# Patient Record
Sex: Female | Born: 1960 | Race: White | Hispanic: No | Marital: Single | Smoking: Current every day smoker
Health system: Southern US, Community
[De-identification: ages and names within clinical notes are randomized; demographics above are authoritative.]

## PROBLEM LIST (undated history)

## (undated) DIAGNOSIS — Z8371 Family history of colonic polyps: Secondary | ICD-10-CM

## (undated) DIAGNOSIS — Z808 Family history of malignant neoplasm of other organs or systems: Secondary | ICD-10-CM

## (undated) DIAGNOSIS — M1990 Unspecified osteoarthritis, unspecified site: Secondary | ICD-10-CM

## (undated) DIAGNOSIS — K219 Gastro-esophageal reflux disease without esophagitis: Secondary | ICD-10-CM

## (undated) DIAGNOSIS — I10 Essential (primary) hypertension: Secondary | ICD-10-CM

## (undated) DIAGNOSIS — R200 Anesthesia of skin: Secondary | ICD-10-CM

## (undated) DIAGNOSIS — Z8051 Family history of malignant neoplasm of kidney: Secondary | ICD-10-CM

## (undated) DIAGNOSIS — G43909 Migraine, unspecified, not intractable, without status migrainosus: Secondary | ICD-10-CM

## (undated) DIAGNOSIS — G4730 Sleep apnea, unspecified: Secondary | ICD-10-CM

## (undated) DIAGNOSIS — Z8489 Family history of other specified conditions: Secondary | ICD-10-CM

## (undated) DIAGNOSIS — Z8042 Family history of malignant neoplasm of prostate: Secondary | ICD-10-CM

## (undated) HISTORY — DX: Family history of malignant neoplasm of kidney: Z80.51

## (undated) HISTORY — PX: ABDOMINAL HYSTERECTOMY: SHX81

## (undated) HISTORY — DX: Family history of malignant neoplasm of other organs or systems: Z80.8

## (undated) HISTORY — PX: BACK SURGERY: SHX140

## (undated) HISTORY — DX: Family history of colonic polyps: Z83.71

## (undated) HISTORY — DX: Family history of malignant neoplasm of prostate: Z80.42

---

## 2004-06-22 ENCOUNTER — Ambulatory Visit: Payer: Self-pay | Admitting: Pain Medicine

## 2004-08-01 ENCOUNTER — Ambulatory Visit: Payer: Self-pay | Admitting: Unknown Physician Specialty

## 2006-02-06 ENCOUNTER — Other Ambulatory Visit: Payer: Self-pay

## 2006-04-10 ENCOUNTER — Other Ambulatory Visit: Payer: Self-pay

## 2006-04-21 ENCOUNTER — Inpatient Hospital Stay: Payer: Self-pay | Admitting: Unknown Physician Specialty

## 2006-04-23 ENCOUNTER — Other Ambulatory Visit: Payer: Self-pay

## 2007-12-02 ENCOUNTER — Ambulatory Visit: Payer: Self-pay | Admitting: Pain Medicine

## 2008-04-06 ENCOUNTER — Ambulatory Visit: Payer: Self-pay | Admitting: Pain Medicine

## 2008-06-02 ENCOUNTER — Ambulatory Visit: Payer: Self-pay | Admitting: Pain Medicine

## 2008-07-05 ENCOUNTER — Ambulatory Visit: Payer: Self-pay | Admitting: Physician Assistant

## 2008-08-03 ENCOUNTER — Ambulatory Visit: Payer: Self-pay | Admitting: Pain Medicine

## 2008-09-21 ENCOUNTER — Ambulatory Visit: Payer: Self-pay | Admitting: Pain Medicine

## 2019-05-10 ENCOUNTER — Other Ambulatory Visit: Payer: Self-pay

## 2019-05-10 ENCOUNTER — Ambulatory Visit
Admission: RE | Admit: 2019-05-10 | Discharge: 2019-05-10 | Disposition: A | Discharge status: Home / Self Care | Payer: Medicare HMO | Source: Ambulatory Visit | Attending: Family Medicine | Admitting: Family Medicine

## 2019-05-10 ENCOUNTER — Ambulatory Visit
Admission: RE | Admit: 2019-05-10 | Discharge: 2019-05-10 | Disposition: A | Discharge status: Home / Self Care | Payer: Medicare HMO | Attending: Family Medicine | Admitting: Family Medicine

## 2019-05-10 ENCOUNTER — Other Ambulatory Visit: Payer: Self-pay | Admitting: Family Medicine

## 2019-05-10 DIAGNOSIS — S8001XA Contusion of right knee, initial encounter: Secondary | ICD-10-CM

## 2019-06-21 ENCOUNTER — Encounter: Payer: Self-pay | Admitting: Family Medicine

## 2019-06-21 ENCOUNTER — Other Ambulatory Visit: Payer: Self-pay

## 2019-06-21 ENCOUNTER — Telehealth: Payer: Self-pay

## 2019-06-21 DIAGNOSIS — R195 Other fecal abnormalities: Secondary | ICD-10-CM

## 2019-06-21 NOTE — Telephone Encounter (Signed)
Gastroenterology Pre-Procedure Review  Request Date: Tuesday 07/13/19 Requesting Physician: Dr. Marius Ditch  PATIENT REVIEW QUESTIONS: The patient responded to the following health history questions as indicated:    1. Are you having any GI issues? no 2. Do you have a personal history of Polyps? no 3. Do you have a family history of Colon Cancer or Polyps? no 4. Diabetes Mellitus? no 5. Joint replacements in the past 12 months?no 6. Major health problems in the past 3 months?no 7. Any artificial heart valves, MVP, or defibrillator?no    MEDICATIONS & ALLERGIES:    Patient reports the following regarding taking any anticoagulation/antiplatelet therapy:   Plavix, Coumadin, Eliquis, Xarelto, Lovenox, Pradaxa, Brilinta, or Effient? no Aspirin? no  Patient confirms/reports the following medications:  Current Outpatient Medications  Medication Sig Dispense Refill  . chlorthalidone (HYGROTON) 50 MG tablet      No current facility-administered medications for this visit.    Patient confirms/reports the following allergies:  Not on File  No orders of the defined types were placed in this encounter.   AUTHORIZATION INFORMATION Primary Insurance: 1D#: Group #:  Secondary Insurance: 1D#: Group #:  SCHEDULE INFORMATION: Date: 07/13/19 Time: Location:[facility]

## 2019-07-07 ENCOUNTER — Other Ambulatory Visit: Payer: Self-pay

## 2019-07-07 ENCOUNTER — Encounter: Payer: Self-pay | Admitting: Gastroenterology

## 2019-07-09 ENCOUNTER — Other Ambulatory Visit: Payer: Self-pay

## 2019-07-09 ENCOUNTER — Other Ambulatory Visit
Admission: RE | Admit: 2019-07-09 | Discharge: 2019-07-09 | Disposition: A | Discharge status: Home / Self Care | Payer: Medicare HMO | Source: Ambulatory Visit | Attending: Gastroenterology | Admitting: Gastroenterology

## 2019-07-09 DIAGNOSIS — Z20822 Contact with and (suspected) exposure to covid-19: Secondary | ICD-10-CM | POA: Insufficient documentation

## 2019-07-09 DIAGNOSIS — Z01812 Encounter for preprocedural laboratory examination: Secondary | ICD-10-CM | POA: Diagnosis present

## 2019-07-09 NOTE — Discharge Instructions (Signed)
General Anesthesia, Adult, Care After This sheet gives you information about how to care for yourself after your procedure. Your health care provider may also give you more specific instructions. If you have problems or questions, contact your health care provider. What can I expect after the procedure? After the procedure, the following side effects are common:  Pain or discomfort at the IV site.  Nausea.  Vomiting.  Sore throat.  Trouble concentrating.  Feeling cold or chills.  Weak or tired.  Sleepiness and fatigue.  Soreness and body aches. These side effects can affect parts of the body that were not involved in surgery. Follow these instructions at home:  For at least 24 hours after the procedure:  Have a responsible adult stay with you. It is important to have someone help care for you until you are awake and alert.  Rest as needed.  Do not: ? Participate in activities in which you could fall or become injured. ? Drive. ? Use heavy machinery. ? Drink alcohol. ? Take sleeping pills or medicines that cause drowsiness. ? Make important decisions or sign legal documents. ? Take care of children on your own. Eating and drinking  Follow any instructions from your health care provider about eating or drinking restrictions.  When you feel hungry, start by eating small amounts of foods that are soft and easy to digest (bland), such as toast. Gradually return to your regular diet.  Drink enough fluid to keep your urine pale yellow.  If you vomit, rehydrate by drinking water, juice, or clear broth. General instructions  If you have sleep apnea, surgery and certain medicines can increase your risk for breathing problems. Follow instructions from your health care provider about wearing your sleep device: ? Anytime you are sleeping, including during daytime naps. ? While taking prescription pain medicines, sleeping medicines, or medicines that make you drowsy.  Return to  your normal activities as told by your health care provider. Ask your health care provider what activities are safe for you.  Take over-the-counter and prescription medicines only as told by your health care provider.  If you smoke, do not smoke without supervision.  Keep all follow-up visits as told by your health care provider. This is important. Contact a health care provider if:  You have nausea or vomiting that does not get better with medicine.  You cannot eat or drink without vomiting.  You have pain that does not get better with medicine.  You are unable to pass urine.  You develop a skin rash.  You have a fever.  You have redness around your IV site that gets worse. Get help right away if:  You have difficulty breathing.  You have chest pain.  You have blood in your urine or stool, or you vomit blood. Summary  After the procedure, it is common to have a sore throat or nausea. It is also common to feel tired.  Have a responsible adult stay with you for the first 24 hours after general anesthesia. It is important to have someone help care for you until you are awake and alert.  When you feel hungry, start by eating small amounts of foods that are soft and easy to digest (bland), such as toast. Gradually return to your regular diet.  Drink enough fluid to keep your urine pale yellow.  Return to your normal activities as told by your health care provider. Ask your health care provider what activities are safe for you. This information is not   intended to replace advice given to you by your health care provider. Make sure you discuss any questions you have with your health care provider. Document Revised: 05/16/2017 Document Reviewed: 12/27/2016 Elsevier Patient Education  2020 [employer]vier [employer]  

## 2019-07-10 LAB — SARS CORONAVIRUS 2 (TAT 6-24 HRS): SARS Coronavirus 2: NEGATIVE

## 2019-07-13 ENCOUNTER — Ambulatory Visit: Payer: Medicare HMO | Admitting: Anesthesiology

## 2019-07-13 ENCOUNTER — Other Ambulatory Visit: Payer: Self-pay

## 2019-07-13 ENCOUNTER — Ambulatory Visit
Admission: RE | Admit: 2019-07-13 | Discharge: 2019-07-13 | Disposition: A | Discharge status: Home / Self Care | Payer: Medicare HMO | Attending: Gastroenterology | Admitting: Gastroenterology

## 2019-07-13 ENCOUNTER — Encounter: Payer: Self-pay | Admitting: Gastroenterology

## 2019-07-13 ENCOUNTER — Encounter
Admission: RE | Disposition: A | Discharge status: Home / Self Care | Payer: Self-pay | Source: Home / Self Care | Attending: Gastroenterology

## 2019-07-13 DIAGNOSIS — R195 Other fecal abnormalities: Secondary | ICD-10-CM | POA: Diagnosis not present

## 2019-07-13 DIAGNOSIS — D123 Benign neoplasm of transverse colon: Secondary | ICD-10-CM | POA: Insufficient documentation

## 2019-07-13 DIAGNOSIS — G709 Myoneural disorder, unspecified: Secondary | ICD-10-CM | POA: Insufficient documentation

## 2019-07-13 DIAGNOSIS — D124 Benign neoplasm of descending colon: Secondary | ICD-10-CM | POA: Diagnosis not present

## 2019-07-13 DIAGNOSIS — M170 Bilateral primary osteoarthritis of knee: Secondary | ICD-10-CM | POA: Insufficient documentation

## 2019-07-13 DIAGNOSIS — G4730 Sleep apnea, unspecified: Secondary | ICD-10-CM | POA: Diagnosis not present

## 2019-07-13 DIAGNOSIS — Z79899 Other long term (current) drug therapy: Secondary | ICD-10-CM | POA: Diagnosis not present

## 2019-07-13 DIAGNOSIS — D122 Benign neoplasm of ascending colon: Secondary | ICD-10-CM | POA: Insufficient documentation

## 2019-07-13 DIAGNOSIS — F17210 Nicotine dependence, cigarettes, uncomplicated: Secondary | ICD-10-CM | POA: Insufficient documentation

## 2019-07-13 DIAGNOSIS — Z90710 Acquired absence of both cervix and uterus: Secondary | ICD-10-CM | POA: Insufficient documentation

## 2019-07-13 DIAGNOSIS — D125 Benign neoplasm of sigmoid colon: Secondary | ICD-10-CM | POA: Diagnosis not present

## 2019-07-13 DIAGNOSIS — I10 Essential (primary) hypertension: Secondary | ICD-10-CM | POA: Insufficient documentation

## 2019-07-13 DIAGNOSIS — Z791 Long term (current) use of non-steroidal anti-inflammatories (NSAID): Secondary | ICD-10-CM | POA: Diagnosis not present

## 2019-07-13 DIAGNOSIS — M479 Spondylosis, unspecified: Secondary | ICD-10-CM | POA: Insufficient documentation

## 2019-07-13 DIAGNOSIS — K635 Polyp of colon: Secondary | ICD-10-CM

## 2019-07-13 DIAGNOSIS — K219 Gastro-esophageal reflux disease without esophagitis: Secondary | ICD-10-CM | POA: Insufficient documentation

## 2019-07-13 DIAGNOSIS — D120 Benign neoplasm of cecum: Secondary | ICD-10-CM | POA: Insufficient documentation

## 2019-07-13 HISTORY — PX: POLYPECTOMY: SHX5525

## 2019-07-13 HISTORY — DX: Gastro-esophageal reflux disease without esophagitis: K21.9

## 2019-07-13 HISTORY — DX: Unspecified osteoarthritis, unspecified site: M19.90

## 2019-07-13 HISTORY — DX: Migraine, unspecified, not intractable, without status migrainosus: G43.909

## 2019-07-13 HISTORY — DX: Anesthesia of skin: R20.0

## 2019-07-13 HISTORY — DX: Sleep apnea, unspecified: G47.30

## 2019-07-13 HISTORY — PX: COLONOSCOPY WITH PROPOFOL: SHX5780

## 2019-07-13 HISTORY — DX: Essential (primary) hypertension: I10

## 2019-07-13 HISTORY — DX: Family history of other specified conditions: Z84.89

## 2019-07-13 SURGERY — COLONOSCOPY WITH PROPOFOL
Anesthesia: General | Site: Rectum

## 2019-07-13 MED ORDER — LABETALOL HCL 5 MG/ML IV SOLN
INTRAVENOUS | Status: DC | PRN
  Administered 2019-07-13 (×5): 5 mg via INTRAVENOUS

## 2019-07-13 MED ORDER — LIDOCAINE HCL (CARDIAC) PF 100 MG/5ML IV SOSY
PREFILLED_SYRINGE | INTRAVENOUS | Status: DC | PRN
  Administered 2019-07-13: 50 mg via INTRAVENOUS

## 2019-07-13 MED ORDER — PROPOFOL 10 MG/ML IV BOLUS
INTRAVENOUS | Status: DC | PRN
  Administered 2019-07-13 (×2): 30 mg via INTRAVENOUS
  Administered 2019-07-13: 20 mg via INTRAVENOUS
  Administered 2019-07-13: 30 mg via INTRAVENOUS
  Administered 2019-07-13: 20 mg via INTRAVENOUS
  Administered 2019-07-13: 30 mg via INTRAVENOUS
  Administered 2019-07-13: 20 mg via INTRAVENOUS
  Administered 2019-07-13: 30 mg via INTRAVENOUS
  Administered 2019-07-13: 40 mg via INTRAVENOUS
  Administered 2019-07-13: 30 mg via INTRAVENOUS
  Administered 2019-07-13: 20 mg via INTRAVENOUS
  Administered 2019-07-13: 30 mg via INTRAVENOUS
  Administered 2019-07-13 (×2): 20 mg via INTRAVENOUS
  Administered 2019-07-13: 70 mg via INTRAVENOUS
  Administered 2019-07-13: 10 mg via INTRAVENOUS
  Administered 2019-07-13: 30 mg via INTRAVENOUS
  Administered 2019-07-13 (×9): 20 mg via INTRAVENOUS
  Administered 2019-07-13: 40 mg via INTRAVENOUS
  Administered 2019-07-13: 30 mg via INTRAVENOUS
  Administered 2019-07-13: 20 mg via INTRAVENOUS
  Administered 2019-07-13: 30 mg via INTRAVENOUS
  Administered 2019-07-13 (×3): 20 mg via INTRAVENOUS
  Administered 2019-07-13: 40 mg via INTRAVENOUS
  Administered 2019-07-13: 30 mg via INTRAVENOUS
  Administered 2019-07-13: 20 mg via INTRAVENOUS

## 2019-07-13 MED ORDER — LACTATED RINGERS IV SOLN: INTRAVENOUS | Status: DC

## 2019-07-13 MED ORDER — STERILE WATER FOR IRRIGATION IR SOLN
Status: DC | PRN
  Administered 2019-07-13: 14:00:00 100 mL

## 2019-07-13 MED ORDER — ONDANSETRON HCL 4 MG/2ML IJ SOLN: 4.0000 mg | Freq: Once | INTRAMUSCULAR | Status: DC | PRN

## 2019-07-13 SURGICAL SUPPLY — 31 items
CANISTER SUCT 1200ML W/VALVE (MISCELLANEOUS) ×3 IMPLANT
CLIP HMST 235XBRD CATH ROT (MISCELLANEOUS) IMPLANT
CLIP RESOLUTION 360 11X235 (MISCELLANEOUS) ×6
ELECT REM PT RETURN 9FT ADLT (ELECTROSURGICAL) ×3
ELECTRODE REM PT RTRN 9FT ADLT (ELECTROSURGICAL) IMPLANT
ELEVIEW  INJECTABLE COMP 10 (MISCELLANEOUS) ×6
FCP ESCP3.2XJMB 240X2.8X (MISCELLANEOUS)
FORCEPS BIOP RAD 4 LRG CAP 4 (CUTTING FORCEPS) IMPLANT
FORCEPS BIOP RJ4 240 W/NDL (MISCELLANEOUS)
FORCEPS ESCP3.2XJMB 240X2.8X (MISCELLANEOUS) IMPLANT
GOWN CVR UNV OPN BCK APRN NK (MISCELLANEOUS) ×2 IMPLANT
GOWN ISOL THUMB LOOP REG UNIV (MISCELLANEOUS) ×6
INJECTABLE ELEVIEW COMP 10 (MISCELLANEOUS) IMPLANT
INJECTOR VARIJECT VIN23 (MISCELLANEOUS) IMPLANT
KIT DEFENDO VALVE AND CONN (KITS) IMPLANT
KIT ENDO PROCEDURE OLY (KITS) ×3 IMPLANT
MARKER SPOT ENDO TATTOO 5ML (MISCELLANEOUS) IMPLANT
NDL 18GX1X1/2 (RX/OR ONLY) (NEEDLE) IMPLANT
NEEDLE 18GX1X1/2 (RX/OR ONLY) (NEEDLE) ×3 IMPLANT
PROBE APC STR FIRE (PROBE) IMPLANT
RETRIEVER NET ROTH 2.5X230 LF (MISCELLANEOUS) ×2 IMPLANT
SNARE COLD EXACTO (MISCELLANEOUS) ×2 IMPLANT
SNARE LASSO HEX 3 IN 1 (INSTRUMENTS) ×4 IMPLANT
SNARE SHORT THROW 13M SML OVAL (MISCELLANEOUS) IMPLANT
SNARE SHORT THROW 30M LRG OVAL (MISCELLANEOUS) IMPLANT
SNARE SNG USE RND 15MM (INSTRUMENTS) IMPLANT
SPOT EX ENDOSCOPIC TATTOO (MISCELLANEOUS)
SYR 10ML LL (SYRINGE) ×2 IMPLANT
TRAP ETRAP POLY (MISCELLANEOUS) IMPLANT
VARIJECT INJECTOR VIN23 (MISCELLANEOUS) ×3
WATER STERILE IRR 250ML POUR (IV SOLUTION) ×3 IMPLANT

## 2019-07-13 NOTE — Anesthesia Postprocedure Evaluation (Signed)
Anesthesia Post Note  Patient: Cynthia Curtis  Procedure(s) Performed: COLONOSCOPY WITH PROPOFOL (N/A Rectum) POLYPECTOMY (N/A Rectum)     Patient location during evaluation: PACU Anesthesia Type: General Level of consciousness: awake and alert Pain management: pain level controlled Vital Signs Assessment: post-procedure vital signs reviewed and stable Respiratory status: spontaneous breathing, nonlabored ventilation, respiratory function stable and patient connected to nasal cannula oxygen Cardiovascular status: blood pressure returned to baseline and stable Postop Assessment: no apparent nausea or vomiting Anesthetic complications: no    Alisa Graff

## 2019-07-13 NOTE — H&P (Signed)
Cephas Darby, MD [address]  [place]  [place], [place] [address]  Main: [phone]  Fax: [phone] Pager: [phone]  Primary Care Physician:  Lynnell Jude, MD Primary Gastroenterologist:  Dr. Cephas Darby  Pre-Procedure History & Physical: HPI:  Cynthia Curtis is a 59 y.o. female is here for an colonoscopy.   Past Medical History:  Diagnosis Date  . Arthritis    knees, lower back  . Bilateral leg numbness    s/p back surgeries  . Family history of adverse reaction to anesthesia    Mother - PONV  . GERD (gastroesophageal reflux disease)   . Hypertension   . Migraine headache    only when using "denture glue"  . Sleep apnea    CPAP    Past Surgical History:  Procedure Laterality Date  . ABDOMINAL HYSTERECTOMY    . BACK SURGERY     x2    Prior to Admission medications   Medication Sig Start Date End Date Taking? Authorizing Provider  CANNABIDIOL PO Take 25 mg by mouth daily. CBD   Yes [provider]  celecoxib (CELEBREX) 200 MG capsule Take 200 mg by mouth daily.   Yes [provider]  chlorthalidone (HYGROTON) 50 MG tablet  03/30/19  Yes [provider]  DULoxetine (CYMBALTA) 60 MG capsule Take 120 mg by mouth daily.   Yes [provider]  esomeprazole (NEXIUM) 20 MG capsule Take 20 mg by mouth daily at 12 noon.   Yes [provider]  nortriptyline (PAMELOR) 50 MG capsule Take 50 mg by mouth 2 (two) times daily.   Yes [provider]  traZODone (DESYREL) 100 MG tablet Take 200 mg by mouth at bedtime.   Yes [provider]  zolpidem (AMBIEN) 10 MG tablet Take 10 mg by mouth at bedtime as needed for sleep.   Yes [provider]    Allergies as of 06/21/2019  . (Not on File)    History reviewed. No pertinent family history.  Social History   Socioeconomic History  . Marital status: Single    Spouse name: Not on file  . Number of children: Not on file  . Years of  education: Not on file  . Highest education level: Not on file  Occupational History  . Not on file  Tobacco Use  . Smoking status: Current Every Day Smoker    Packs/day: 1.00    Years: 40.00    Pack years: 40.00    Types: Cigarettes  . Smokeless tobacco: Never Used  . Tobacco comment: since teenager  Substance and Sexual Activity  . Alcohol use: Not Currently  . Drug use: Not on file  . Sexual activity: Not on file  Other Topics Concern  . Not on file  Social History Narrative  . Not on file   Social Determinants of Health   Financial Resource Strain:   . Difficulty of Paying Living Expenses: Not on file  Food Insecurity:   . Worried About Charity fundraiser in the Last Year: Not on file  . Ran Out of Food in the Last Year: Not on file  Transportation Needs:   . Lack of Transportation (Medical): Not on file  . Lack of Transportation (Non-Medical): Not on file  Physical Activity:   . Days of Exercise per Week: Not on file  . Minutes of Exercise per Session: Not on file  Stress:   . Feeling of Stress : Not on file  Social  Connections:   . Frequency of Communication with Friends and Family: Not on file  . Frequency of Social Gatherings with Friends and Family: Not on file  . Attends Religious Services: Not on file  . Active Member of Clubs or Organizations: Not on file  . Attends Archivist Meetings: Not on file  . Marital Status: Not on file  Intimate Partner Violence:   . Fear of Current or Ex-Partner: Not on file  . Emotionally Abused: Not on file  . Physically Abused: Not on file  . Sexually Abused: Not on file    Review of Systems: See HPI, otherwise negative ROS  Physical Exam: BP (!) 146/96   Pulse (!) 110   Temp (!) 97.2 F (36.2 C) (Temporal)   Resp 16   Ht 5\' 1"  (1.549 m)   Wt 71.2 kg   SpO2 97%   BMI 29.66 kg/m  General:   Alert,  pleasant and cooperative in NAD Head:  Normocephalic and atraumatic. Neck:  Supple; no masses or  thyromegaly. Lungs:  Clear throughout to auscultation.    Heart:  Regular rate and rhythm. Abdomen:  Soft, nontender and nondistended. Normal bowel sounds, without guarding, and without rebound.   Neurologic:  Alert and  oriented x4;  grossly normal neurologically.  Impression/Plan: Cynthia Curtis is here for an colonoscopy to be performed for positive cologaurd  Risks, benefits, limitations, and alternatives regarding  colonoscopy have been reviewed with the patient.  Questions have been answered.  All parties agreeable.   Sherri Sear, MD  07/13/2019, 1:20 PM

## 2019-07-13 NOTE — Anesthesia Procedure Notes (Signed)
Performed by: Whittney Steenson, CRNA Pre-anesthesia Checklist: Patient identified, Emergency Drugs available, Suction available, Timeout performed and Patient being monitored Patient Re-evaluated:Patient Re-evaluated prior to induction Oxygen Delivery Method: Nasal cannula Placement Confirmation: positive ETCO2       

## 2019-07-13 NOTE — Op Note (Signed)
[facility] Gastroenterology Patient Name: Cynthia Curtis Procedure Date: 07/13/2019 2:07 PM MRN: [id number] Account #: [id number] Date of Birth: 1961-05-07 Admit Type: Outpatient Age: 59 Room: [facility] OR ROOM 01 Gender: Female Note Status: Finalized Procedure:             Colonoscopy Indications:           This is the patient's first colonoscopy, Positive                         Cologuard test Providers:             Lin Landsman MD, MD Referring MD:          Lynnell Jude (Referring MD) Medicines:             Monitored Anesthesia Care Complications:         No immediate complications. Estimated blood loss:                         Minimal. Procedure:             Pre-Anesthesia Assessment:                        - Prior to the procedure, a History and Physical was                         performed, and patient medications and allergies were                         reviewed. The patient is competent. The risks and                         benefits of the procedure and the sedation options and                         risks were discussed with the patient. All questions                         were answered and informed consent was obtained.                         Patient identification and proposed procedure were                         verified by the physician, the nurse, the                         anesthesiologist, the anesthetist and the technician                         in the pre-procedure area in the procedure room in the                         endoscopy suite. Mental Status Examination: alert and                         oriented. Airway Examination: normal oropharyngeal  airway and neck mobility. Respiratory Examination:                         clear to auscultation. CV Examination: normal.                         Prophylactic Antibiotics: The patient does not require                         prophylactic antibiotics. Prior  Anticoagulants: The                         patient has taken no previous anticoagulant or                         antiplatelet agents. ASA Grade Assessment: II - A                         patient with mild systemic disease. After reviewing                         the risks and benefits, the patient was deemed in                         satisfactory condition to undergo the procedure. The                         anesthesia plan was to use monitored anesthesia care                         (MAC). Immediately prior to administration of                         medications, the patient was re-assessed for adequacy                         to receive sedatives. The heart rate, respiratory                         rate, oxygen saturations, blood pressure, adequacy of                         pulmonary ventilation, and response to care were                         monitored throughout the procedure. The physical                         status of the patient was re-assessed after the                         procedure.                        After obtaining informed consent, the colonoscope was                         passed under direct vision. Throughout the procedure,  the patient's blood pressure, pulse, and oxygen                         saturations were monitored continuously. The was                         introduced through the anus and advanced to the the                         cecum, identified by appendiceal orifice and ileocecal                         valve. The colonoscopy was unusually difficult due to                         inadequate bowel prep, significant looping and the                         patient's body habitus. Successful completion of the                         procedure was aided by applying abdominal pressure.                         The patient tolerated the procedure well. The quality                         of the bowel preparation was adequate  to identify                         polyps 6 mm and larger in size. Findings:      The perianal and digital rectal examinations were normal. Pertinent       negatives include normal sphincter tone and no palpable rectal lesions.      A 6 mm polyp was found in the cecum. The polyp was flat. Preparations       were made for mucosal resection. Saline was injected to raise the       lesion. Snare mucosal resection was performed. Resection and retrieval       were complete.      A 15 mm polyp was found in the cecum. The polyp was carpet-like and       flat. Preparations were made for mucosal resection. Eleview was injected       to raise the lesion. Snare mucosal resection was performed. Resection       was incomplete. The resected tissue was retrieved.      Three sessile polyps were found in the transverse colon (2) and       ascending colon (1). The polyps were 10 to 12 mm in size. These polyps       were removed with a hot snare. Resection and retrieval were complete. To       prevent bleeding after mucosal resection, two hemostatic clips were       successfully placed (MR conditional). There was no bleeding during, or       at the end, of the procedure.      Four sessile polyps were found in the descending colon. The polyps were       6 to 8 mm  in size. These polyps were removed with a hot snare. Resection       and retrieval were complete.      Five sessile polyps were found in the sigmoid colon. The polyps were 5       to 7 mm in size. These polyps were removed with a hot snare (4) and cold       snare (1). Resection and retrieval were complete.      The retroflexed view of the distal rectum and anal verge was normal and       showed no anal or rectal abnormalities. Impression:            - One 6 mm polyp in the cecum, removed with mucosal                         resection. Resected and retrieved.                        - One 15 mm polyp in the cecum, removed with mucosal                          resection. Incomplete resection. Resected tissue                         retrieved.                        - Three 10 to 12 mm polyps in the transverse colon and                         in the ascending colon, removed with a hot snare.                         Resected and retrieved. Clips (MR conditional) were                         placed.                        - Four 6 to 8 mm polyps in the descending colon,                         removed with a hot snare. Resected and retrieved.                        - Five 5 to 7 mm polyps in the sigmoid colon, removed                         with a hot snare and cold snare. Resected and                         retrieved.                        - Mucosal resection was performed. Resection and                         retrieval were complete.                        -  Mucosal resection was performed. Resection was                         incomplete. The resected tissue was retrieved. Recommendation:        - Discharge patient to home (with escort).                        - Resume previous diet today.                        - Continue present medications.                        - Await pathology results.                        - Repeat colonoscopy in 6 months with 2 day prep for                         surveillance after piecemeal polypectomy. Procedure Code(s):     --- Professional ---                        [phone], Colonoscopy, flexible; with endoscopic mucosal                         resection                        [id number], 59, Colonoscopy, flexible; with removal of                         tumor(s), polyp(s), or other lesion(s) by snare                         technique Diagnosis Code(s):     --- Professional ---                        K63.5, Polyp of colon                        R19.5, Other fecal abnormalities CPT copyright 2019 American Medical Association. All rights reserved. The codes documented in this report are preliminary and  upon coder review may  be revised to meet current compliance requirements. Dr. Ulyess Mort Lin Landsman MD, MD 07/13/2019 3:53:45 PM This report has been signed electronically. Number of Addenda: 0 Note Initiated On: 07/13/2019 2:07 PM Scope Withdrawal Time: 1 hour 20 minutes 10 seconds  Total Procedure Duration: 1 hour 29 minutes 3 seconds  Estimated Blood Loss:  Estimated blood loss: none. Estimated blood loss was                         minimal. Estimated blood loss was minimal.      [facility]

## 2019-07-13 NOTE — Anesthesia Preprocedure Evaluation (Addendum)
Anesthesia Evaluation  Patient identified by MRN, date of birth, ID band Patient awake    Reviewed: Allergy & Precautions, H&P , NPO status , Patient's Chart, lab work & pertinent test results, reviewed documented beta blocker date and time   Airway Mallampati: II  TM Distance: >3 FB Neck ROM: full    Dental no notable dental hx.    Pulmonary sleep apnea and Continuous Positive Airway Pressure Ventilation , Current Smoker,    Pulmonary exam normal breath sounds clear to auscultation       Cardiovascular Exercise Tolerance: Good hypertension,  Rhythm:regular Rate:Normal     Neuro/Psych  Headaches,  Neuromuscular disease (BLE neuropathy) negative psych ROS   GI/Hepatic Neg liver ROS, GERD  ,  Endo/Other  negative endocrine ROS  Renal/GU negative Renal ROS  negative genitourinary   Musculoskeletal   Abdominal   Peds  Hematology negative hematology ROS (+)   Anesthesia Other Findings   Reproductive/Obstetrics negative OB ROS                            Anesthesia Physical Anesthesia Plan  ASA: II  Anesthesia Plan: General   Post-op Pain Management:    Induction:   PONV Risk Score and Plan: 2 and Propofol infusion and TIVA  Airway Management Planned:   Additional Equipment:   Intra-op Plan:   Post-operative Plan:   Informed Consent: I have reviewed the patients History and Physical, chart, labs and discussed the procedure including the risks, benefits and alternatives for the proposed anesthesia with the patient or authorized representative who has indicated his/her understanding and acceptance.     Dental Advisory Given  Plan Discussed with: CRNA  Anesthesia Plan Comments:         Anesthesia Quick Evaluation

## 2019-07-13 NOTE — Transfer of Care (Signed)
Immediate Anesthesia Transfer of Care Note  Patient: Cynthia Curtis  Procedure(s) Performed: COLONOSCOPY WITH PROPOFOL (N/A Rectum) POLYPECTOMY (N/A Rectum)  Patient Location: PACU  Anesthesia Type: General  Level of Consciousness: awake, alert  and patient cooperative  Airway and Oxygen Therapy: Patient Spontanous Breathing and Patient connected to supplemental oxygen  Post-op Assessment: Post-op Vital signs reviewed, Patient's Cardiovascular Status Stable, Respiratory Function Stable, Patent Airway and No signs of Nausea or vomiting  Post-op Vital Signs: Reviewed and stable  Complications: No apparent anesthesia complications

## 2019-07-14 ENCOUNTER — Encounter: Payer: Self-pay | Admitting: *Deleted

## 2019-07-15 ENCOUNTER — Encounter: Payer: Self-pay | Admitting: Gastroenterology

## 2019-07-15 LAB — SURGICAL PATHOLOGY

## 2019-12-14 ENCOUNTER — Other Ambulatory Visit: Payer: Self-pay

## 2019-12-14 DIAGNOSIS — Z86010 Personal history of colonic polyps: Secondary | ICD-10-CM

## 2019-12-14 MED ORDER — NA SULFATE-K SULFATE-MG SULF 17.5-3.13-1.6 GM/177ML PO SOLN: 354.0000 mL | Freq: Once | ORAL | 0 refills | Status: AC

## 2019-12-14 MED ORDER — MAGNESIUM CITRATE PO SOLN: 1.0000 | Freq: Once | ORAL | 0 refills | Status: AC

## 2020-01-17 ENCOUNTER — Other Ambulatory Visit: Payer: Self-pay

## 2020-01-17 ENCOUNTER — Other Ambulatory Visit
Admission: RE | Admit: 2020-01-17 | Discharge: 2020-01-17 | Disposition: A | Discharge status: Home / Self Care | Payer: Medicare HMO | Source: Ambulatory Visit | Attending: Gastroenterology | Admitting: Gastroenterology

## 2020-01-17 DIAGNOSIS — Z01812 Encounter for preprocedural laboratory examination: Secondary | ICD-10-CM | POA: Insufficient documentation

## 2020-01-17 DIAGNOSIS — Z20822 Contact with and (suspected) exposure to covid-19: Secondary | ICD-10-CM | POA: Diagnosis not present

## 2020-01-18 LAB — SARS CORONAVIRUS 2 (TAT 6-24 HRS): SARS Coronavirus 2: NEGATIVE

## 2020-01-19 ENCOUNTER — Ambulatory Visit: Payer: Medicare HMO | Admitting: Anesthesiology

## 2020-01-19 ENCOUNTER — Encounter: Payer: Self-pay | Admitting: Gastroenterology

## 2020-01-19 ENCOUNTER — Other Ambulatory Visit: Payer: Self-pay

## 2020-01-19 ENCOUNTER — Ambulatory Visit
Admission: RE | Admit: 2020-01-19 | Discharge: 2020-01-19 | Disposition: A | Discharge status: Home / Self Care | Payer: Medicare HMO | Attending: Gastroenterology | Admitting: Gastroenterology

## 2020-01-19 ENCOUNTER — Telehealth: Payer: Self-pay

## 2020-01-19 ENCOUNTER — Encounter
Admission: RE | Disposition: A | Discharge status: Home / Self Care | Payer: Self-pay | Source: Home / Self Care | Attending: Gastroenterology

## 2020-01-19 DIAGNOSIS — Z79899 Other long term (current) drug therapy: Secondary | ICD-10-CM | POA: Insufficient documentation

## 2020-01-19 DIAGNOSIS — Z860100 Personal history of colon polyps, unspecified: Secondary | ICD-10-CM

## 2020-01-19 DIAGNOSIS — G4730 Sleep apnea, unspecified: Secondary | ICD-10-CM | POA: Insufficient documentation

## 2020-01-19 DIAGNOSIS — K219 Gastro-esophageal reflux disease without esophagitis: Secondary | ICD-10-CM | POA: Diagnosis not present

## 2020-01-19 DIAGNOSIS — Z791 Long term (current) use of non-steroidal anti-inflammatories (NSAID): Secondary | ICD-10-CM | POA: Diagnosis not present

## 2020-01-19 DIAGNOSIS — Z09 Encounter for follow-up examination after completed treatment for conditions other than malignant neoplasm: Secondary | ICD-10-CM | POA: Diagnosis present

## 2020-01-19 DIAGNOSIS — D124 Benign neoplasm of descending colon: Secondary | ICD-10-CM | POA: Insufficient documentation

## 2020-01-19 DIAGNOSIS — I10 Essential (primary) hypertension: Secondary | ICD-10-CM | POA: Insufficient documentation

## 2020-01-19 DIAGNOSIS — F17210 Nicotine dependence, cigarettes, uncomplicated: Secondary | ICD-10-CM | POA: Insufficient documentation

## 2020-01-19 DIAGNOSIS — K635 Polyp of colon: Secondary | ICD-10-CM | POA: Diagnosis not present

## 2020-01-19 HISTORY — PX: COLONOSCOPY WITH PROPOFOL: SHX5780

## 2020-01-19 SURGERY — COLONOSCOPY WITH PROPOFOL
Anesthesia: General

## 2020-01-19 MED ORDER — FENTANYL CITRATE (PF) 100 MCG/2ML IJ SOLN
INTRAMUSCULAR | Status: AC
  Filled 2020-01-19: qty 2

## 2020-01-19 MED ORDER — FENTANYL CITRATE (PF) 100 MCG/2ML IJ SOLN
INTRAMUSCULAR | Status: DC | PRN
Start: 2020-01-19 — End: 2020-01-19
  Administered 2020-01-19 (×2): 50 ug via INTRAVENOUS

## 2020-01-19 MED ORDER — MIDAZOLAM HCL 2 MG/2ML IJ SOLN
INTRAMUSCULAR | Status: AC
  Filled 2020-01-19: qty 2

## 2020-01-19 MED ORDER — PROPOFOL 10 MG/ML IV BOLUS
INTRAVENOUS | Status: DC | PRN
  Administered 2020-01-19: 50 mg via INTRAVENOUS

## 2020-01-19 MED ORDER — ONDANSETRON HCL 4 MG/2ML IJ SOLN
INTRAMUSCULAR | Status: DC | PRN
  Administered 2020-01-19: 4 mg via INTRAVENOUS

## 2020-01-19 MED ORDER — PROPOFOL 500 MG/50ML IV EMUL
INTRAVENOUS | Status: DC | PRN
  Administered 2020-01-19: 75 ug/kg/min via INTRAVENOUS

## 2020-01-19 MED ORDER — SODIUM CHLORIDE 0.9 % IV SOLN: INTRAVENOUS | Status: DC

## 2020-01-19 MED ORDER — MIDAZOLAM HCL 2 MG/2ML IJ SOLN
INTRAMUSCULAR | Status: DC | PRN
  Administered 2020-01-19: 2 mg via INTRAVENOUS

## 2020-01-19 NOTE — Transfer of Care (Signed)
Immediate Anesthesia Transfer of Care Note  Patient: Cynthia Curtis  Procedure(s) Performed: COLONOSCOPY WITH PROPOFOL (N/A )  Patient Location: PACU  Anesthesia Type:General  Level of Consciousness: awake and alert   Airway & Oxygen Therapy: Patient Spontanous Breathing and Patient connected to nasal cannula oxygen  Post-op Assessment: Report given to RN and Post -op Vital signs reviewed and stable  Post vital signs: Reviewed and stable  Last Vitals:  Vitals Value Taken Time  BP    Temp    Pulse    Resp    SpO2      Last Pain:  Vitals:   01/19/20 0834  PainSc: 0-No pain         Complications: No complications documented.

## 2020-01-19 NOTE — Anesthesia Preprocedure Evaluation (Signed)
Anesthesia Evaluation  Patient identified by MRN, date of birth, ID band Patient awake    Reviewed: Allergy & Precautions, H&P , NPO status , Patient's Chart, lab work & pertinent test results, reviewed documented beta blocker date and time   History of Anesthesia Complications Negative for: history of anesthetic complications  Airway Mallampati: II  TM Distance: >3 FB Neck ROM: full    Dental  (+) Edentulous Upper, Edentulous Lower   Pulmonary sleep apnea and Continuous Positive Airway Pressure Ventilation , neg COPD, Current SmokerPatient did not abstain from smoking.,    Pulmonary exam normal breath sounds clear to auscultation       Cardiovascular Exercise Tolerance: Good METShypertension, (-) CAD and (-) Past MI (-) dysrhythmias  Rhythm:regular Rate:Normal - Systolic murmurs    Neuro/Psych  Headaches,  Neuromuscular disease (BLE neuropathy) negative psych ROS   GI/Hepatic Neg liver ROS, GERD  Medicated and Controlled,  Endo/Other  negative endocrine ROSneg diabetes  Renal/GU negative Renal ROS  negative genitourinary   Musculoskeletal   Abdominal   Peds  Hematology negative hematology ROS (+)   Anesthesia Other Findings Past Medical History: No date: Arthritis     Comment:  knees, lower back No date: Bilateral leg numbness     Comment:  s/p back surgeries No date: Family history of adverse reaction to anesthesia     Comment:  Mother - PONV No date: GERD (gastroesophageal reflux disease) No date: Hypertension No date: Migraine headache     Comment:  only when using "denture glue" No date: Sleep apnea     Comment:  CPAP  Reproductive/Obstetrics negative OB ROS                             Anesthesia Physical  Anesthesia Plan  ASA: II  Anesthesia Plan: General   Post-op Pain Management:    Induction: Intravenous  PONV Risk Score and Plan: 2 and Propofol infusion and  TIVA  Airway Management Planned: Nasal Cannula  Additional Equipment: None  Intra-op Plan:   Post-operative Plan:   Informed Consent: I have reviewed the patients History and Physical, chart, labs and discussed the procedure including the risks, benefits and alternatives for the proposed anesthesia with the patient or authorized representative who has indicated his/her understanding and acceptance.     Dental Advisory Given  Plan Discussed with: CRNA  Anesthesia Plan Comments: (Discussed risks of anesthesia with patient, including possibility of difficulty with spontaneous ventilation under anesthesia necessitating airway intervention, PONV, and rare risks such as cardiac or respiratory or neurological events. Patient understands.)        Anesthesia Quick Evaluation

## 2020-01-19 NOTE — H&P (Signed)
Cephas Darby, MD [address]  [place]  [place], [place] [address]  Main: [phone]  Fax: [phone] Pager: [phone]  Primary Care Physician:  Lynnell Jude, MD Primary Gastroenterologist:  Dr. Cephas Darby  Pre-Procedure History & Physical: HPI:  Cynthia Curtis is a 59 y.o. female is here for an colonoscopy.   Past Medical History:  Diagnosis Date  . Arthritis    knees, lower back  . Bilateral leg numbness    s/p back surgeries  . Family history of adverse reaction to anesthesia    Mother - PONV  . GERD (gastroesophageal reflux disease)   . Hypertension   . Migraine headache    only when using "denture glue"  . Sleep apnea    CPAP    Past Surgical History:  Procedure Laterality Date  . ABDOMINAL HYSTERECTOMY    . BACK SURGERY     x2  . COLONOSCOPY WITH PROPOFOL N/A 07/13/2019   Procedure: COLONOSCOPY WITH PROPOFOL;  Surgeon: Lin Landsman, MD;  Location: [facility];  Service: Endoscopy;  Laterality: N/A;  sleep apnea Priority 3  . POLYPECTOMY N/A 07/13/2019   Procedure: POLYPECTOMY;  Surgeon: Lin Landsman, MD;  Location: [facility];  Service: Endoscopy;  Laterality: N/A;    Prior to Admission medications   Medication Sig Start Date End Date Taking? Authorizing Provider  CANNABIDIOL PO Take 25 mg by mouth daily. CBD   Yes [provider]  celecoxib (CELEBREX) 200 MG capsule Take 200 mg by mouth daily.   Yes [provider]  chlorthalidone (HYGROTON) 50 MG tablet  03/30/19  Yes [provider]  DULoxetine (CYMBALTA) 60 MG capsule Take 120 mg by mouth daily.   Yes [provider]  esomeprazole (NEXIUM) 20 MG capsule Take 20 mg by mouth daily at 12 noon.   Yes [provider]  lisinopril (ZESTRIL) 5 MG tablet Take 5 mg by mouth daily.   Yes [provider]  nortriptyline (PAMELOR) 50 MG capsule Take 50 mg by mouth 2 (two) times daily.   Yes [provider]    traZODone (DESYREL) 100 MG tablet Take 200 mg by mouth at bedtime.   Yes [provider]  zolpidem (AMBIEN) 10 MG tablet Take 10 mg by mouth at bedtime as needed for sleep.   Yes [provider]    Allergies as of 12/14/2019 - Review Complete 07/13/2019  Allergen Reaction Noted  . Ceftin [cefuroxime] Swelling 07/07/2019  . Other  07/07/2019    History reviewed. No pertinent family history.  Social History   Socioeconomic History  . Marital status: Single    Spouse name: Not on file  . Number of children: Not on file  . Years of education: Not on file  . Highest education level: Not on file  Occupational History  . Not on file  Tobacco Use  . Smoking status: Current Every Day Smoker    Packs/day: 1.00    Years: 40.00    Pack years: 40.00    Types: Cigarettes  . Smokeless tobacco: Never Used  . Tobacco comment: since teenager  Vaping Use  . Vaping Use: Never used  Substance and Sexual Activity  . Alcohol use: Not Currently  . Drug use: Not on file  . Sexual activity: Not on file  Other Topics Concern  . Not on file  Social History Narrative  . Not on file   Social Determinants of Health   Financial Resource Strain:   .  Difficulty of Paying Living Expenses: Not on file  Food Insecurity:   . Worried About Charity fundraiser in the Last Year: Not on file  . Ran Out of Food in the Last Year: Not on file  Transportation Needs:   . Lack of Transportation (Medical): Not on file  . Lack of Transportation (Non-Medical): Not on file  Physical Activity:   . Days of Exercise per Week: Not on file  . Minutes of Exercise per Session: Not on file  Stress:   . Feeling of Stress : Not on file  Social Connections:   . Frequency of Communication with Friends and Family: Not on file  . Frequency of Social Gatherings with Friends and Family: Not on file  . Attends Religious Services: Not on file  . Active Member of Clubs or Organizations: Not on file  .  Attends Archivist Meetings: Not on file  . Marital Status: Not on file  Intimate Partner Violence:   . Fear of Current or Ex-Partner: Not on file  . Emotionally Abused: Not on file  . Physically Abused: Not on file  . Sexually Abused: Not on file    Review of Systems: See HPI, otherwise negative ROS  Physical Exam: BP (!) 142/97   Pulse (!) 112   Temp 97.9 F (36.6 C)   Resp 18   Ht 5\' 2"  (1.575 m)   Wt 68.5 kg   SpO2 96%   BMI 27.62 kg/m  General:   Alert,  pleasant and cooperative in NAD Head:  Normocephalic and atraumatic. Neck:  Supple; no masses or thyromegaly. Lungs:  Clear throughout to auscultation.    Heart:  Regular rate and rhythm. Abdomen:  Soft, nontender and nondistended. Normal bowel sounds, without guarding, and without rebound.   Neurologic:  Alert and  oriented x4;  grossly normal neurologically.  Impression/Plan: Cynthia Curtis is here for an colonoscopy to be performed for personal h/o adenomas  Risks, benefits, limitations, and alternatives regarding  colonoscopy have been reviewed with the patient.  Questions have been answered.  All parties agreeable.   Sherri Sear, MD  01/19/2020, 8:47 AM

## 2020-01-19 NOTE — Anesthesia Postprocedure Evaluation (Signed)
Anesthesia Post Note  Patient: Cynthia Curtis  Procedure(s) Performed: COLONOSCOPY WITH PROPOFOL (N/A )  Patient location during evaluation: Endoscopy Anesthesia Type: General Level of consciousness: awake and alert Pain management: pain level controlled Vital Signs Assessment: post-procedure vital signs reviewed and stable Respiratory status: spontaneous breathing, nonlabored ventilation, respiratory function stable and patient connected to nasal cannula oxygen Cardiovascular status: blood pressure returned to baseline and stable Postop Assessment: no apparent nausea or vomiting Anesthetic complications: no   No complications documented.   Last Vitals:  Vitals:   01/19/20 0940 01/19/20 0950  BP: 99/68 100/72  Pulse: (!) 103 99  Resp: 17 18  Temp:    SpO2: 99% 99%    Last Pain:  Vitals:   01/19/20 0834  PainSc: 0-No pain                 Arita Miss

## 2020-01-19 NOTE — Telephone Encounter (Signed)
-----   Message from Lin Landsman, MD sent at 01/19/2020  8:49 AM EDT ----- Regarding: Referral Please refer her to genetic counselor for h/o multiple ademonas of colon  RV

## 2020-01-19 NOTE — Op Note (Signed)
[facility]astroenter[facility] Patient Name: Cynthia Curtis Procedure Date: 01/19/2020 8:47 AM MRN: [id number] Account #: [id number] Date of Birth: 1961-01-22 Admit Type: Outpatient Age: 59 Room: [facility] ENDO ROOM 4 Gender: Female Note Status: Finalized Procedure:             Colonoscopy Indications:           High risk colon cancer surveillance: Personal history                         of adenoma (10 mm or greater in size), High risk colon                         cancer surveillance: Personal history of sessile                         serrated colon polyp (10 mm or greater in size), Last                         colonoscopy: February 2021 Providers:             Lin Landsman MD, MD Medicines:             Monitored Anesthesia Care Complications:         No immediate complications. Estimated blood loss: None. Procedure:             Pre-Anesthesia Assessment:                        - Prior to the procedure, a History and Physical was                         performed, and patient medications and allergies were                         reviewed. The patient is competent. The risks and                         benefits of the procedure and the sedation options and                         risks were discussed with the patient. All questions                         were answered and informed consent was obtained.                         Patient identification and proposed procedure were                         verified by the physician, the nurse, the                         anesthesiologist, the anesthetist and the technician                         in the pre-procedure area in the procedure room in the  endoscopy suite. Mental Status Examination: alert and                         oriented. Airway Examination: normal oropharyngeal                         airway and neck mobility. Respiratory Examination:                         clear to auscultation. CV  Examination: normal.                         Prophylactic Antibiotics: The patient does not require                         prophylactic antibiotics. Prior Anticoagulants: The                         patient has taken no previous anticoagulant or                         antiplatelet agents. ASA Grade Assessment: II - A                         patient with mild systemic disease. After reviewing                         the risks and benefits, the patient was deemed in                         satisfactory condition to undergo the procedure. The                         anesthesia plan was to use monitored anesthesia care                         (MAC). Immediately prior to administration of                         medications, the patient was re-assessed for adequacy                         to receive sedatives. The heart rate, respiratory                         rate, oxygen saturations, blood pressure, adequacy of                         pulmonary ventilation, and response to care were                         monitored throughout the procedure. The physical                         status of the patient was re-assessed after the                         procedure.  After obtaining informed consent, the colonoscope was                         passed under direct vision. Throughout the procedure,                         the patient's blood pressure, pulse, and oxygen                         saturations were monitored continuously. The                         Colonoscope was introduced through the anus and                         advanced to the the cecum, identified by appendiceal                         orifice and ileocecal valve. The colonoscopy was                         performed with moderate difficulty due to restricted                         mobility of the colon and significant looping.                         Successful completion of the procedure was aided by                          changing the patient to a prone position and applying                         abdominal pressure. The patient tolerated the                         procedure fairly well. The quality of the bowel                         preparation was evaluated using the BBPS [facility] Bowel                         Preparation Scale) with scores of: Right Colon = 3,                         Transverse Colon = 3 and Left Colon = 3 (entire mucosa                         seen well with no residual staining, small fragments                         of stool or opaque liquid). The total BBPS score                         equals 9. Findings:      The perianal and digital rectal examinations were normal. Pertinent       negatives include normal sphincter tone and  no palpable rectal lesions.      A 6 mm polyp was found in the descending colon. The polyp was sessile.       The polyp was removed with a cold snare. Resection and retrieval were       complete.      The retroflexed view of the distal rectum and anal verge was normal and       showed no anal or rectal abnormalities. Impression:            - One 6 mm polyp in the descending colon, removed with                         a cold snare. Resected and retrieved.                        - The distal rectum and anal verge are normal on                         retroflexion view. Recommendation:        - Discharge patient to home (with escort).                        - High fiber diet today.                        - Continue present medications.                        - Await pathology results.                        - Repeat colonoscopy in 3 years for surveillance of                         multiple polyps. Procedure Code(s):     --- Professional ---                        [phone], Colonoscopy, flexible; with removal of                         tumor(s), polyp(s), or other lesion(s) by snare                         technique Diagnosis Code(s):      --- Professional ---                        K63.5, Polyp of colon                        Z86.010, Personal history of colonic polyps CPT copyright 2019 [employer]. All rights reserved. The codes documented in this report are preliminary and upon coder review may  be revised to meet current compliance requirements. Dr. Ulyess Mort Lin Landsman MD, MD 01/19/2020 9:22:29 AM This report has been signed electronically. Number of Addenda: 0 Note Initiated On: 01/19/2020 8:47 AM Scope Withdrawal Time: 0 hours 10 minutes 12 seconds  Total Procedure Duration: 0 hours 20 minutes 26 seconds  Estimated Blood Loss:  Estimated blood loss: none.      [facility]

## 2020-01-19 NOTE — Telephone Encounter (Signed)
Placed referral for patient

## 2020-01-20 ENCOUNTER — Encounter: Payer: Self-pay | Admitting: Gastroenterology

## 2020-01-20 LAB — SURGICAL PATHOLOGY

## 2020-01-21 ENCOUNTER — Encounter: Payer: Self-pay | Admitting: Gastroenterology

## 2020-01-24 ENCOUNTER — Encounter: Payer: Self-pay | Admitting: Licensed Clinical Social Worker

## 2020-01-24 ENCOUNTER — Inpatient Hospital Stay: Payer: Medicare HMO

## 2020-01-24 ENCOUNTER — Inpatient Hospital Stay: Payer: Medicare HMO | Attending: Oncology | Admitting: Licensed Clinical Social Worker

## 2020-01-24 ENCOUNTER — Other Ambulatory Visit: Payer: Self-pay

## 2020-01-24 DIAGNOSIS — Z8051 Family history of malignant neoplasm of kidney: Secondary | ICD-10-CM | POA: Diagnosis not present

## 2020-01-24 DIAGNOSIS — Z8042 Family history of malignant neoplasm of prostate: Secondary | ICD-10-CM | POA: Diagnosis not present

## 2020-01-24 DIAGNOSIS — Z8371 Family history of colonic polyps: Secondary | ICD-10-CM

## 2020-01-24 DIAGNOSIS — Z808 Family history of malignant neoplasm of other organs or systems: Secondary | ICD-10-CM

## 2020-01-24 DIAGNOSIS — Z83719 Family history of colon polyps, unspecified: Secondary | ICD-10-CM | POA: Insufficient documentation

## 2020-01-24 DIAGNOSIS — Z86010 Personal history of colonic polyps: Secondary | ICD-10-CM

## 2020-01-24 NOTE — Progress Notes (Signed)
REFERRING PROVIDER: Lin Landsman, MD [address] [place],  [place] [address]  PRIMARY PROVIDER:  Lynnell Jude, MD  PRIMARY REASON FOR VISIT:  1. History of colonic polyps   2. Family history of colonic polyps   3. Family history of kidney cancer   4. Family history of prostate cancer   5. Family history of bone cancer      HISTORY OF PRESENT ILLNESS:   Cynthia Curtis, a 59 y.o. female, was seen for a [place] cancer genetics consultation at the request of Dr. Marius Ditch due to a personal history of colon polyps.  Cynthia Curtis presents to clinic today to discuss the possibility of a hereditary predisposition to cancer, genetic testing, and to further clarify her future cancer risks, as well as potential cancer risks for family members.   Cynthia Curtis is a 59 y.o. female with no personal history of cancer. She had her first colonoscopy 6 months ago that revealed 14 polyps, and another colonoscopy recently that revealed an additional polyp for a total of 15 cumulative polyps. Six of these polyps were tubular adenomas.   CANCER HISTORY:  Oncology History   No history exists.     RISK FACTORS:  Menarche was at age 17.  First live birth at age 44.  OCP use: yes Ovaries intact: no.  Hysterectomy: yes.  Menopausal status: postmenopausal.  HRT use: 0 years. Colonoscopy: yes; details above. Mammogram within the last year: yes. Number of breast biopsies: 0. Up to date with pelvic exams: yes. Any excessive radiation exposure in the past: no  Past Medical History:  Diagnosis Date  . Arthritis    knees, lower back  . Bilateral leg numbness    s/p back surgeries  . Family history of adverse reaction to anesthesia    Mother - PONV  . Family history of bone cancer   . Family history of colonic polyps   . Family history of kidney cancer   . Family history of prostate cancer   . GERD (gastroesophageal reflux disease)   . Hypertension   . Migraine headache    only when using  "denture glue"  . Sleep apnea    CPAP    Past Surgical History:  Procedure Laterality Date  . ABDOMINAL HYSTERECTOMY    . BACK SURGERY     x2  . COLONOSCOPY WITH PROPOFOL N/A 07/13/2019   Procedure: COLONOSCOPY WITH PROPOFOL;  Surgeon: Lin Landsman, MD;  Location: [facility];  Service: Endoscopy;  Laterality: N/A;  sleep apnea Priority 3  . COLONOSCOPY WITH PROPOFOL N/A 01/19/2020   Procedure: COLONOSCOPY WITH PROPOFOL;  Surgeon: Lin Landsman, MD;  Location: [facility] ENDOSCOPY;  Service: Gastroenterology;  Laterality: N/A;  . POLYPECTOMY N/A 07/13/2019   Procedure: POLYPECTOMY;  Surgeon: Lin Landsman, MD;  Location: [facility];  Service: Endoscopy;  Laterality: N/A;    Social History   Socioeconomic History  . Marital status: Single    Spouse name: Not on file  . Number of children: Not on file  . Years of education: Not on file  . Highest education level: Not on file  Occupational History  . Not on file  Tobacco Use  . Smoking status: Current Every Day Smoker    Packs/day: 1.00    Years: 40.00    Pack years: 40.00    Types: Cigarettes  . Smokeless tobacco: Never Used  . Tobacco comment: since teenager  Vaping Use  . Vaping Use: Never used  Substance and Sexual Activity  . Alcohol use: Not Currently  . Drug use: Not on file  . Sexual activity: Not on file  Other Topics Concern  . Not on file  Social History Narrative  . Not on file   Social Determinants of Health   Financial Resource Strain:   . Difficulty of Paying Living Expenses: Not on file  Food Insecurity:   . Worried About Charity fundraiser in the Last Year: Not on file  . Ran Out of Food in the Last Year: Not on file  Transportation Needs:   . Lack of Transportation (Medical): Not on file  . Lack of Transportation (Non-Medical): Not on file  Physical Activity:   . Days of Exercise per Week: Not on file  . Minutes of Exercise per Session: Not on file  Stress:   .  Feeling of Stress : Not on file  Social Connections:   . Frequency of Communication with Friends and Family: Not on file  . Frequency of Social Gatherings with Friends and Family: Not on file  . Attends Religious Services: Not on file  . Active Member of Clubs or Organizations: Not on file  . Attends Archivist Meetings: Not on file  . Marital Status: Not on file     FAMILY HISTORY:  We obtained a detailed, 4-generation family history.  Significant diagnoses are listed below: Family History  Problem Relation Age of Onset  . Cancer Mother        stage IV, unsure primary  . Colon polyps Father   . Colon polyps Sister   . Bone cancer Maternal Aunt   . Prostate cancer Maternal Grandfather   . Kidney cancer Maternal Uncle   . Other Maternal Uncle        possible cancer  . Colon polyps Cousin        "fair amount"   Cynthia Curtis has 2 sons, ages 63 and 4 and a daughter, age 52. None have had cancer or colonoscopies. Patient has 1 sister, 11, who has had at least 8 colon polyps.   Cynthia Curtis mother is living at 28. She was diagnosed with stage IV cancer, unsure the primary. Patient had 7 maternal uncles and 3 maternal aunts. An uncle had kidney cancer. Another uncle also possibly had cancer, he had agent orange exposure. An aunt had bone cancer. Maternal grandfather had prostate cancer, he passed at 68. Grandmother also passed at 46.  Cynthia Curtis father died at 31. He had colon polyps but she is not sure the amount. Patient had 1 paternal aunt. She did not have cancer or colon polyps that patient is aware of. She did have a daughter, patient's cousin, who has had a "fair amount" of colon polyps. Paternal grandmother died at 37, grandfather died at 83, no cancers.   Cynthia Curtis is unaware of previous family history of genetic testing for hereditary cancer risks. Patient's maternal ancestors are of [place], [place] and [place] descent, and paternal ancestors are of [place] descent. There  is no reported Ashkenazi Jewish ancestry. There is no known consanguinity.  GENETIC COUNSELING ASSESSMENT: Cynthia Curtis is a 59 y.o. female with a personal history of colon polyps which is somewhat suggestive of a hereditary cancer syndrome/hereditary polyposis syndrome and predisposition to cancer. We, therefore, discussed and recommended the following at today's visit.   DISCUSSION:  We discussed that polyps in general are common, however, most people have fewer than 5 lifetime polyps.  When an individual  has 10 or more polyps we become concerned about an underlying polyposis syndrome.  The most common hereditary polyposis syndromes are caused by problems in the APC and MUTYH genes, however, more recently, mutations in the [place] and MSH3 genes have been identified in some polyposis families. We discussed that testing is beneficial for several reasons including knowing how to follow individuals for cancer screenings, and understand if other family members could be at risk for cancer and allow them to undergo genetic testing.   We reviewed the characteristics, features and inheritance patterns of hereditary cancer syndromes. We also discussed genetic testing, including the appropriate family members to test, the process of testing, insurance coverage and turn-around-time for results. We discussed the implications of a negative, positive and/or variant of uncertain significant result. We recommended Cynthia Curtis pursue genetic testing for the [facility] Multi-Cancer gene panel.   The Multi-Cancer Panel offered by [facility] includes sequencing and/or deletion duplication testing of the following 85 genes: AIP, ALK, APC, ATM, AXIN2,BAP1,  BARD1, BLM, BMPR1A, BRCA1, BRCA2, BRIP1, CASR, CDC73, CDH1, CDK4, CDKN1B, CDKN1C, CDKN2A (p14ARF), CDKN2A (p16INK4a), CEBPA, CHEK2, CTNNA1, DICER1, DIS3L2, EGFR (c.2369C>T, p.Thr790Met variant only), EPCAM (Deletion/duplication testing only), FH, FLCN, GATA2, GPC3, GREM1 (Promoter region  deletion/duplication testing only), HOXB13 (c.251G>A, p.Gly84Glu), HRAS, KIT, MAX, MEN1, MET, MITF (c.952G>A, p.Glu318Lys variant only), MLH1, MSH2, MSH3, MSH6, MUTYH, NBN, NF1, NF2, NTHL1, PALB2, PDGFRA, PHOX2B, PMS2, POLD1, POLE, POT1, PRKAR1A, PTCH1, PTEN, RAD50, RAD51C, RAD51D, RB1, RECQL4, RET, RNF43, RUNX1, SDHAF2, SDHA (sequence changes only), SDHB, SDHC, SDHD, SMAD4, SMARCA4, SMARCB1, SMARCE1, STK11, SUFU, TERC, TERT, TMEM127, TP53, TSC1, TSC2, VHL, WRN and WT1.   Based on Cynthia Curtis's personal history of polyps, she meets medical criteria for genetic testing. Despite that she meets criteria, she may still have an out of pocket cost.   We discussed that some people do not want to undergo genetic testing due to fear of genetic discrimination.  A federal law called the Genetic Information Non-Discrimination Act (GINA) of 2008 helps protect individuals against genetic discrimination based on their genetic test results.  It impacts both health insurance and employment.  For health insurance, it protects against increased premiums, being kicked off insurance or being forced to take a test in order to be insured.  For employment it protects against hiring, firing and promoting decisions based on genetic test results.  Health status due to a cancer diagnosis is not protected under GINA.  This law does not protect life insurance, disability insurance, or other types of insurance.   PLAN: After considering the risks, benefits, and limitations, Cynthia Curtis provided informed consent to pursue genetic testing and the blood sample was sent to [facility] for analysis of the Multi-Cancer Panel. Results should be available within approximately 2-3 weeks' time, at which point they will be disclosed by telephone to Cynthia Curtis, as will any additional recommendations warranted by these results. Cynthia Curtis will receive a summary of her genetic counseling visit and a copy of her results once available. This information  will also be available in Epic.   Lastly, we encouraged Cynthia Curtis to remain in contact with cancer genetics annually so that we can continuously update the family history and inform her of any changes in cancer genetics and testing that may be of benefit for this family.   Cynthia Curtis questions were answered to her satisfaction today. Our contact information was provided should additional questions or concerns arise. Thank you for the referral and allowing [place] to share in the care  of your patient.   Faith Rogue, MS, [facility] Genetic Counselor [email or web address] Phone: [phone]  The patient was seen for a total of 30 minutes in face-to-face genetic counseling.  Dr. Grayland Ormond was available for discussion regarding this case.   _______________________________________________________________________ For Office Staff:  Number of people involved in session: 1 Was an Intern/ student involved with case: no

## 2020-02-03 ENCOUNTER — Telehealth: Payer: Self-pay | Admitting: Licensed Clinical Social Worker

## 2020-02-03 NOTE — Telephone Encounter (Signed)
Revealed negative genetic testing.  .  We discussed that we do not know why she has polyps or why there is cancer in the family. It could be due to a different gene that we are not testing, or something our current technology cannot pick up.  It will be important for her to keep in contact with genetics to learn if additional testing may be needed in the future.

## 2020-02-04 ENCOUNTER — Encounter: Payer: Self-pay | Admitting: Licensed Clinical Social Worker

## 2020-02-04 ENCOUNTER — Ambulatory Visit: Payer: Self-pay | Admitting: Licensed Clinical Social Worker

## 2020-02-04 DIAGNOSIS — Z8371 Family history of colonic polyps: Secondary | ICD-10-CM

## 2020-02-04 DIAGNOSIS — Z808 Family history of malignant neoplasm of other organs or systems: Secondary | ICD-10-CM

## 2020-02-04 DIAGNOSIS — Z8051 Family history of malignant neoplasm of kidney: Secondary | ICD-10-CM

## 2020-02-04 DIAGNOSIS — Z8042 Family history of malignant neoplasm of prostate: Secondary | ICD-10-CM

## 2020-02-04 DIAGNOSIS — Z86010 Personal history of colonic polyps: Secondary | ICD-10-CM

## 2020-02-04 DIAGNOSIS — Z1379 Encounter for other screening for genetic and chromosomal anomalies: Secondary | ICD-10-CM

## 2020-02-04 NOTE — Progress Notes (Signed)
HPI:  Cynthia Curtis was previously seen in the [place] clinic due to a personal history of colon polyps and concerns regarding a hereditary predisposition to cancer. Please refer to our prior cancer genetics clinic note for more information regarding our discussion, assessment and recommendations, at the time. Cynthia Curtis recent genetic test results were disclosed to her, as were recommendations warranted by these results. These results and recommendations are discussed in more detail below.  CANCER HISTORY:  Oncology History   No history exists.    FAMILY HISTORY:  We obtained a detailed, 4-generation family history.  Significant diagnoses are listed below: Family History  Problem Relation Age of Onset  . Cancer Mother        stage IV, unsure primary  . Colon polyps Father   . Colon polyps Sister   . Bone cancer Maternal Aunt   . Prostate cancer Maternal Grandfather   . Kidney cancer Maternal Uncle   . Other Maternal Uncle        possible cancer  . Colon polyps Cousin        "fair amount"   Cynthia Curtis has 2 sons, ages 51 and 73 and a daughter, age 48. None have had cancer or colonoscopies. Patient has 1 sister, 66, who has had at least 8 colon polyps.   Cynthia Curtis mother is living at 71. She was diagnosed with stage IV cancer, unsure the primary. Patient had 7 maternal uncles and 3 maternal aunts. An uncle had kidney cancer. Another uncle also possibly had cancer, he had agent orange exposure. An aunt had bone cancer. Maternal grandfather had prostate cancer, he passed at 36. Grandmother also passed at 39.  Cynthia Curtis father died at 80. He had colon polyps but she is not sure the amount. Patient had 1 paternal aunt. She did not have cancer or colon polyps that patient is aware of. She did have a daughter, patient's cousin, who has had a "fair amount" of colon polyps. Paternal grandmother died at 30, grandfather died at 1, no cancers.   Cynthia Curtis is unaware of  previous family history of genetic testing for hereditary cancer risks. Patient's maternal ancestors are of [place], [place] and [place] descent, and paternal ancestors are of [place] descent. There is no reported Ashkenazi Jewish ancestry. There is no known consanguinity.  GENETIC TEST RESULTS: Genetic testing reported out on 02/03/2020 through the [facility] Multi-cancer panel found no pathogenic mutations.   The Multi-Cancer Panel offered by [facility] includes sequencing and/or deletion duplication testing of the following 85 genes: AIP, ALK, APC, ATM, AXIN2,BAP1,  BARD1, BLM, BMPR1A, BRCA1, BRCA2, BRIP1, CASR, CDC73, CDH1, CDK4, CDKN1B, CDKN1C, CDKN2A (p14ARF), CDKN2A (p16INK4a), CEBPA, CHEK2, CTNNA1, DICER1, DIS3L2, EGFR (c.2369C>T, p.Thr790Met variant only), EPCAM (Deletion/duplication testing only), FH, FLCN, GATA2, GPC3, GREM1 (Promoter region deletion/duplication testing only), HOXB13 (c.251G>A, p.Gly84Glu), HRAS, KIT, MAX, MEN1, MET, MITF (c.952G>A, p.Glu318Lys variant only), MLH1, MSH2, MSH3, MSH6, MUTYH, NBN, NF1, NF2, NTHL1, PALB2, PDGFRA, PHOX2B, PMS2, POLD1, POLE, POT1, PRKAR1A, PTCH1, PTEN, RAD50, RAD51C, RAD51D, RB1, RECQL4, RET, RNF43, RUNX1, SDHAF2, SDHA (sequence changes only), SDHB, SDHC, SDHD, SMAD4, SMARCA4, SMARCB1, SMARCE1, STK11, SUFU, TERC, TERT, TMEM127, TP53, TSC1, TSC2, VHL, WRN and WT1.   The test report has been scanned into EPIC and is located under the Molecular Pathology section of the Results Review tab.  A portion of the result report is included below for reference.     We discussed with Cynthia Curtis that because current genetic testing is not perfect,  it is possible there may be a gene mutation in one of these genes that current testing cannot detect, but that chance is small.  We also discussed, that there could be another gene that has not yet been discovered, or that we have not yet tested, that is responsible for the cancer diagnoses in the family. It is also possible there  is a hereditary cause for the cancer in the family that Cynthia Curtis did not inherit and therefore was not identified in her testing.  Therefore, it is important to remain in touch with cancer genetics in the future so that we can continue to offer Cynthia Curtis the most up to date genetic testing.   ADDITIONAL GENETIC TESTING: We discussed with Cynthia Curtis that her genetic testing was fairly extensive.  If there are genes identified to increase cancer risk that can be analyzed in the future, we would be happy to discuss and coordinate this testing at that time.    CANCER SCREENING RECOMMENDATIONS: Cynthia Curtis test result is considered negative (normal).  This means that we have not identified a hereditary cause for her personal history of polyps and family history of cancer at this time.   While reassuring, this does not definitively rule out a hereditary predisposition to cancer. It is still possible that there could be genetic mutations that are undetectable by current technology. There could be genetic mutations in genes that have not been tested or identified to increase cancer risk.  Therefore, it is recommended she continue to follow the cancer management and screening guidelines provided by her primary healthcare provider.   An individual's cancer risk and medical management are not determined by genetic test results alone. Overall cancer risk assessment incorporates additional factors, including personal medical history, family history, and any available genetic information that may result in a personalized plan for cancer prevention and surveillance.  This negative genetic test simply tells [place] that we cannot yet define why Cynthia Curtis has had an increased number of colorectal polyps.  Cynthia Curtis medical management and screening should be based on the prospect that she will likely form more colon polyps  and should, therefore, undergo more frequent colonoscopy screening at intervals determined by her GI  providers.  We also recommended that Cynthia Curtis have an upper endoscopy periodically.  RECOMMENDATIONS FOR FAMILY MEMBERS:  Relatives in this family might be at some increased risk of developing cancer, over the general population risk, simply due to the family history of cancer.  We recommended female relatives in this family have a yearly mammogram beginning at age 59, or 64 years younger than the earliest onset of cancer, an annual clinical breast exam, and perform monthly breast self-exams. Female relatives in this family should also have a gynecological exam as recommended by their primary provider.  All family members should be referred for colonoscopy starting at age 71.   FOLLOW-UP: Lastly, we discussed with Cynthia Curtis that cancer genetics is a rapidly advancing field and it is possible that new genetic tests will be appropriate for her and/or her family members in the future. We encouraged her to remain in contact with cancer genetics on an annual basis so we can update her personal and family histories and let her know of advances in cancer genetics that may benefit this family.   Our contact number was provided. Cynthia Curtis questions were answered to her satisfaction, and she knows she is welcome to call us at anytime with additional questions or concerns.  Faith Rogue, MS, [facility] Genetic Counselor [email or web address] Phone: [phone]

## 2021-08-29 IMAGING — CR DG KNEE COMPLETE 4+V*R*
4 series · 4 of 4 positions shown · non-contrast
Comparison: None.

CLINICAL DATA: Initial evaluation for pain in medial side of knee
for 2 weeks, contusion.

EXAM:
RIGHT KNEE - COMPLETE 4+ VIEW

[knee ap]
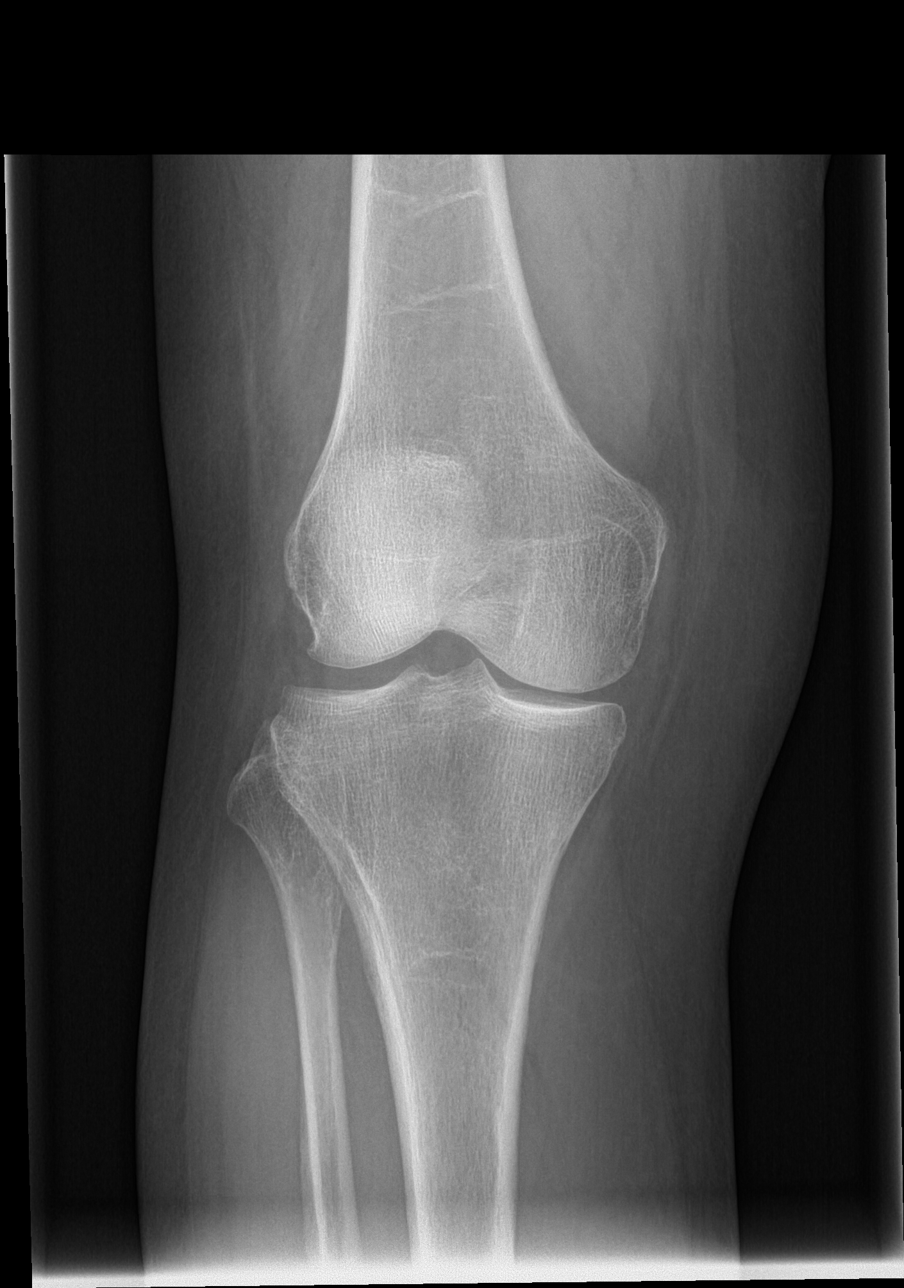

[knee lat]
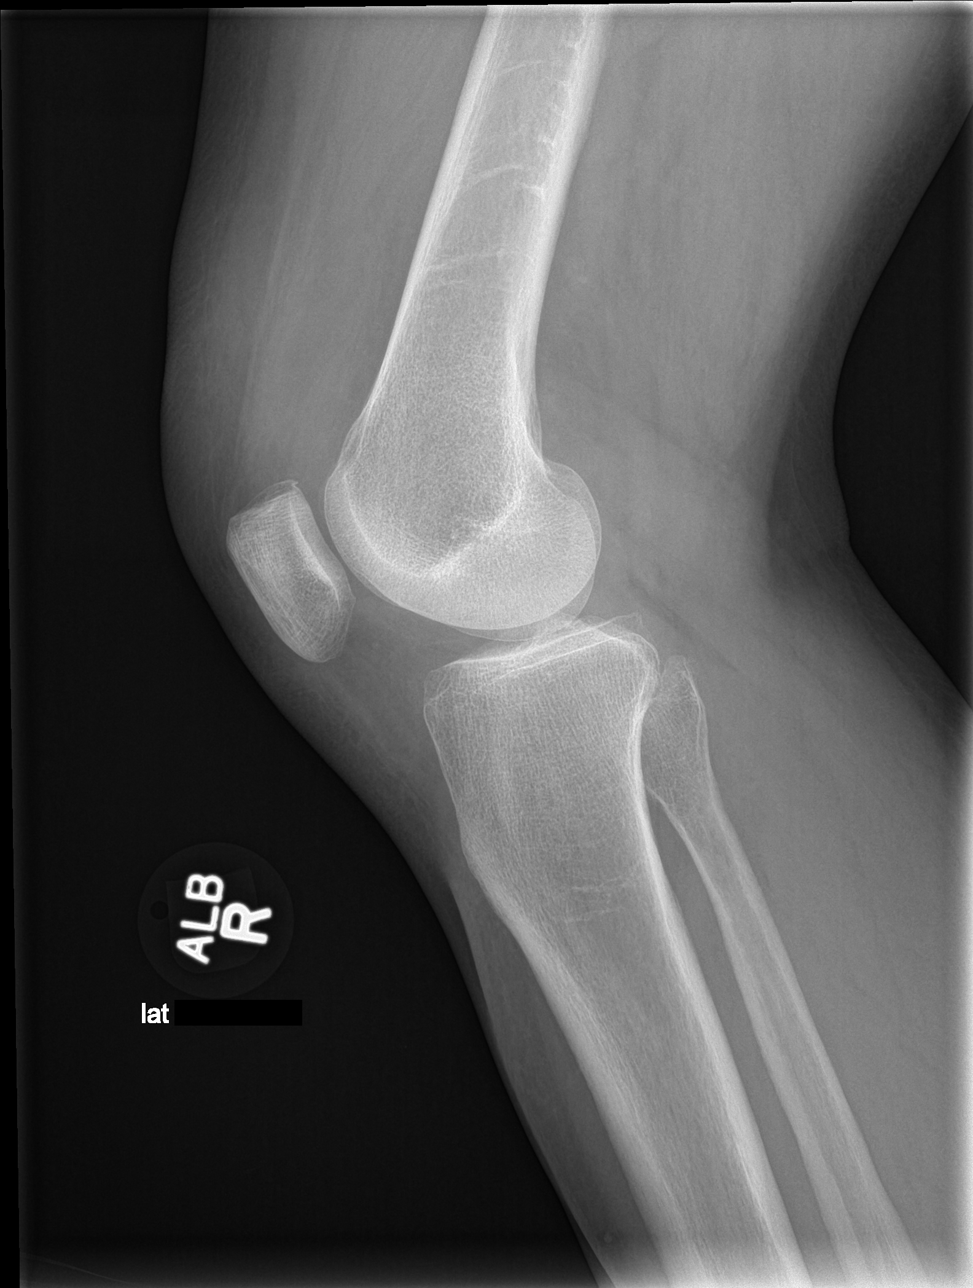

[tunnel]
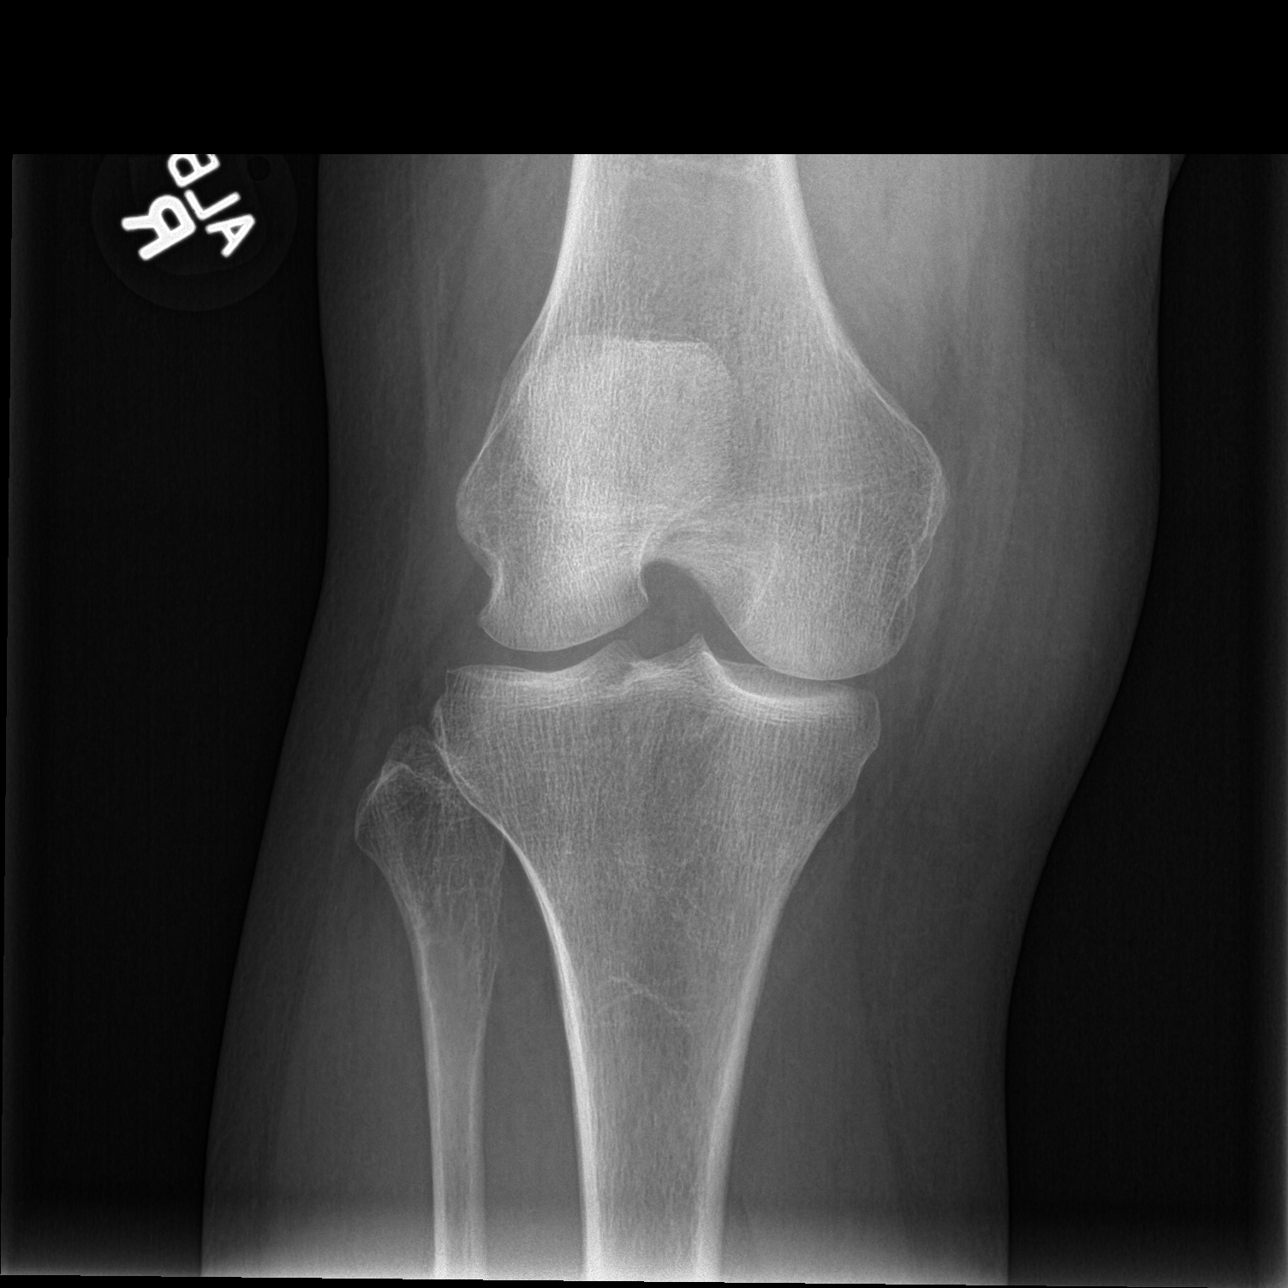

[patella skyline]
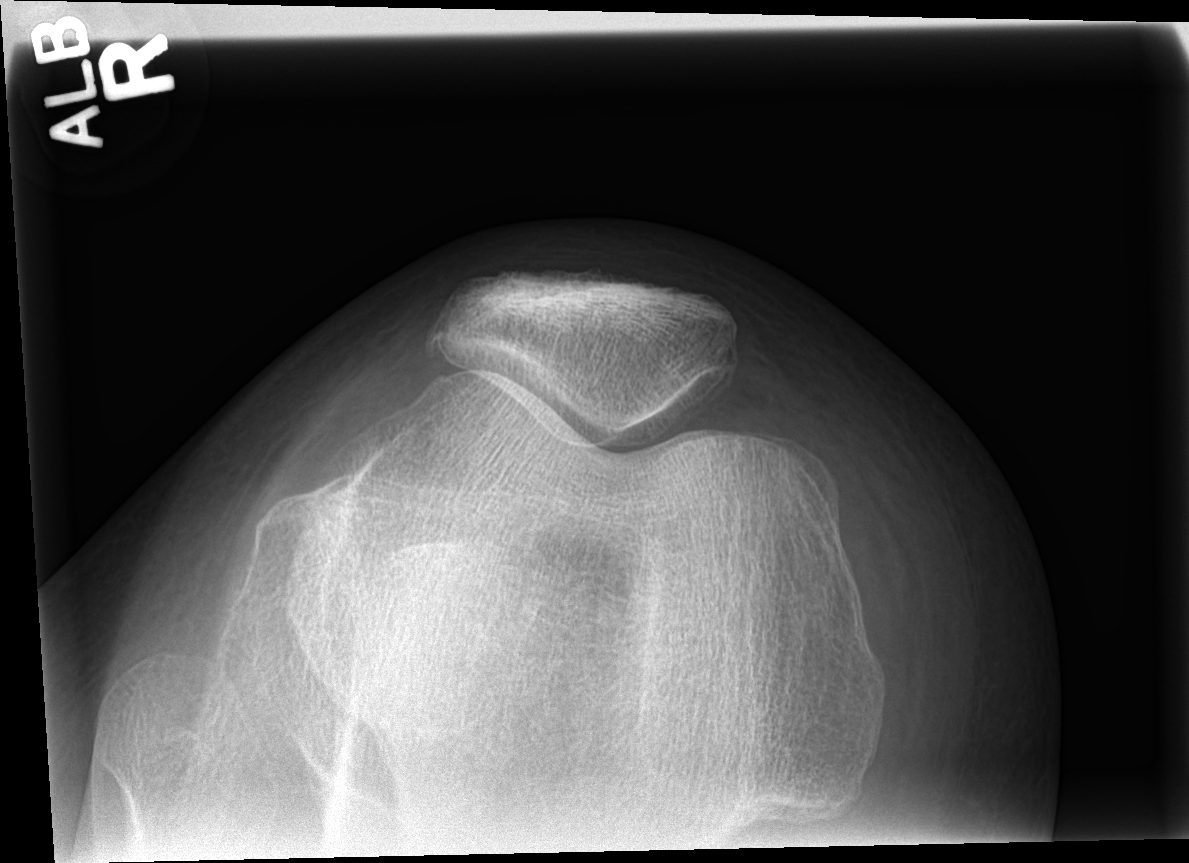

[4 of 4 positions shown; findings below may reference images not displayed]

FINDINGS: No acute fracture dislocation. No visible significant joint
effusion. Mild tricompartmental juxta-articular spurring. Osseous
mineralization normal. No visible soft tissue injury.
IMPRESSION: 1. No acute osseous abnormality.
2. Mild tricompartmental osteoarthritic changes for age.

## 2023-04-22 ENCOUNTER — Encounter: Payer: Self-pay | Admitting: Family Medicine

## 2023-05-02 ENCOUNTER — Encounter: Payer: Self-pay | Admitting: *Deleted
